# Patient Record
Sex: Male | Born: 2000 | Hispanic: Yes | Marital: Single | Smoking: Never smoker
Health system: Southern US, Community
[De-identification: ages and names within clinical notes are randomized; demographics above are authoritative.]

---
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
## 2019-01-13 ENCOUNTER — Other Ambulatory Visit: Payer: Self-pay

## 2019-01-13 DIAGNOSIS — Z20822 Contact with and (suspected) exposure to covid-19: Secondary | ICD-10-CM

## 2019-01-16 LAB — NOVEL CORONAVIRUS, NAA: SARS-CoV-2, NAA: NOT DETECTED

## 2020-06-18 ENCOUNTER — Emergency Department: Payer: Medicaid Other

## 2020-06-18 ENCOUNTER — Encounter: Payer: Self-pay | Admitting: Emergency Medicine

## 2020-06-18 ENCOUNTER — Other Ambulatory Visit: Payer: Self-pay

## 2020-06-18 ENCOUNTER — Emergency Department
Admission: EM | Admit: 2020-06-18 | Discharge: 2020-06-18 | Disposition: A | Discharge status: Home / Self Care | Payer: Medicaid Other | Attending: Emergency Medicine | Admitting: Emergency Medicine

## 2020-06-18 DIAGNOSIS — Y92410 Unspecified street and highway as the place of occurrence of the external cause: Secondary | ICD-10-CM | POA: Diagnosis not present

## 2020-06-18 DIAGNOSIS — M542 Cervicalgia: Secondary | ICD-10-CM | POA: Diagnosis not present

## 2020-06-18 DIAGNOSIS — M25522 Pain in left elbow: Secondary | ICD-10-CM | POA: Insufficient documentation

## 2020-06-18 DIAGNOSIS — S0990XA Unspecified injury of head, initial encounter: Secondary | ICD-10-CM | POA: Insufficient documentation

## 2020-06-18 DIAGNOSIS — M7918 Myalgia, other site: Secondary | ICD-10-CM

## 2020-06-18 MED ORDER — CYCLOBENZAPRINE HCL 10 MG PO TABS: 10.0000 mg | ORAL_TABLET | Freq: Three times a day (TID) | ORAL | 0 refills | Status: AC | PRN

## 2020-06-18 MED ORDER — NAPROXEN 500 MG PO TABS: 500.0000 mg | ORAL_TABLET | Freq: Two times a day (BID) | ORAL | 0 refills | Status: AC

## 2020-06-18 MED ORDER — CYCLOBENZAPRINE HCL 10 MG PO TABS
10.0000 mg | ORAL_TABLET | Freq: Once | ORAL | Status: AC
  Administered 2020-06-18: 10 mg via ORAL
  Filled 2020-06-18: qty 1

## 2020-06-18 MED ORDER — NAPROXEN 500 MG PO TABS
500.0000 mg | ORAL_TABLET | Freq: Once | ORAL | Status: AC
  Administered 2020-06-18: 500 mg via ORAL
  Filled 2020-06-18: qty 1

## 2020-06-18 MED ORDER — TRAMADOL HCL 50 MG PO TABS: 50.0000 mg | ORAL_TABLET | Freq: Four times a day (QID) | ORAL | 0 refills | Status: AC | PRN

## 2020-06-18 NOTE — Discharge Instructions (Signed)
Apply ice to the sore areas off and on throughout the day.  Follow up with primary care or return to the ER for symptoms of concern.
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
